# Patient Record
Sex: Female | Born: 1995 | Hispanic: Yes | Marital: Single
Health system: Southern US, Community
[De-identification: ages and names within clinical notes are randomized; demographics above are authoritative.]

---

## 2020-09-16 ENCOUNTER — Emergency Department (HOSPITAL_COMMUNITY)
Admission: EM | Admit: 2020-09-16 | Discharge: 2020-09-17 | Disposition: A | Discharge status: Home / Self Care | Payer: Self-pay | Attending: Emergency Medicine | Admitting: Emergency Medicine

## 2020-09-16 ENCOUNTER — Encounter (HOSPITAL_COMMUNITY): Payer: Self-pay | Admitting: Emergency Medicine

## 2020-09-16 ENCOUNTER — Other Ambulatory Visit: Payer: Self-pay

## 2020-09-16 DIAGNOSIS — M546 Pain in thoracic spine: Secondary | ICD-10-CM | POA: Insufficient documentation

## 2020-09-16 NOTE — ED Triage Notes (Signed)
Pt c/o mid to upper back pain x 1 month. Reports work related injury at beginning of the year,no new injuries.

## 2020-09-17 ENCOUNTER — Emergency Department (HOSPITAL_COMMUNITY): Payer: Self-pay

## 2020-09-17 MED ORDER — IBUPROFEN 800 MG PO TABS: 800.0000 mg | ORAL_TABLET | Freq: Three times a day (TID) | ORAL | 0 refills | Status: AC | PRN

## 2020-09-17 MED ORDER — DICLOFENAC SODIUM 1 % EX GEL: 2.0000 g | Freq: Four times a day (QID) | CUTANEOUS | 0 refills | Status: AC

## 2020-09-17 MED ORDER — TIZANIDINE HCL 4 MG PO TABS: 4.0000 mg | ORAL_TABLET | Freq: Four times a day (QID) | ORAL | 0 refills | Status: AC | PRN

## 2020-09-17 NOTE — Discharge Instructions (Addendum)
You have been seen in the Emergency Department (ED)  today for back pain.  Your workup and exam have not shown any acute abnormalities and you are likely suffering from muscle strain or possible problems with your discs, but there is no treatment that will fix your symptoms at this time.  Please take Motrin (ibuprofen) as needed for your pain according to the instructions written on the box.  Alternatively, for the next five days you can take 800mg three times daily with meals (it may upset your stomach).   Please follow up with your doctor as soon as possible regarding today's ED visit and your back pain.  Return to the ED for worsening back pain, fever, weakness or numbness of either leg, or if you develop either (1) an inability to urinate or have bowel movements, or (2) loss of your ability to control your bathroom functions (if you start having "accidents"), or if you develop other new symptoms that concern you.  

## 2020-09-17 NOTE — ED Notes (Signed)
Patient transported to X-ray 

## 2020-09-17 NOTE — ED Provider Notes (Signed)
Emergency Department Provider Note   I have reviewed the triage vital signs and the nursing notes.   HISTORY  Chief Complaint Back Pain   HPI Gabriela Henry is a 25 y.o. female with past medical history reviewed below presents to the emergency department with intermittent thoracic back pain.  Patient states that she injured herself at work in January.  She has had intermittent pain in the area despite physical therapy and evaluation by [employer] physician.  No clear diagnosis was made.  She is not having weakness.  The patient does have some radiation of discomfort into the shoulders.  She is no longer doing physical therapy.  No numbness in the arms or legs.  No additional injuries.  She is able to work but only 2 days/week and has to take frequent rests.  She is mainly taking Tylenol for pain and feels that that is working less and less.  She had more severe pain yesterday which prompted an ED evaluation today.   History reviewed. No pertinent past medical history.  There are no problems to display for this patient.   History reviewed. No pertinent surgical history.  Allergies Patient has no known allergies.  No family history on file.  Social History    Review of Systems  Constitutional: No fever/chills Cardiovascular: Denies chest pain. Respiratory: Denies shortness of breath. Gastrointestinal: No abdominal pain.  Genitourinary: Negative for dysuria. Musculoskeletal: Positive for thoracic back pain. Skin: Negative for rash. Neurological: Negative for focal weakness or numbness.   ____________________________________________   PHYSICAL EXAM:  VITAL SIGNS: ED Triage Vitals [09/16/20 2140]  Enc Vitals Group     BP 127/64     Pulse Rate 85     Resp 16     Temp 99.3 F (37.4 C)     Temp Source Oral     SpO2 98 %   Constitutional: Alert and oriented. Well appearing and in no acute distress. Eyes: Conjunctivae are normal.  Head:  Atraumatic. Nose: No congestion/rhinnorhea. Mouth/Throat: Mucous membranes are moist.   Neck: No stridor.  No cervical spine tenderness to palpation. Cardiovascular: Good peripheral circulation.  Respiratory: Normal respiratory effort.   Gastrointestinal: No distention.  Musculoskeletal: No gross deformities of extremities. Mid-thoracic back tenderness in the midline and paraspinal musculature. No step-off.  Neurologic:  Normal speech and language. No gross focal neurologic deficits are appreciated.  Skin:  Skin is warm, dry and intact. No rash noted.  ____________________________________________  RADIOLOGY  DG Thoracic Spine 2 View  Result Date: 09/17/2020 CLINICAL DATA:  25 year old female with mid back pain, no history of trauma. EXAM: THORACIC SPINE 2 VIEWS COMPARISON:  None. FINDINGS: There is no evidence of thoracic spine fracture. Alignment is normal. No other significant bone abnormalities are identified. IMPRESSION: No evidence of acute fracture or malalignment. No significant degenerative changes. Electronically Signed   By: Marliss Coots MD   On: 09/17/2020 09:07    ____________________________________________   PROCEDURES  Procedure(s) performed:   Procedures  None  ____________________________________________   INITIAL IMPRESSION / ASSESSMENT AND PLAN / ED COURSE  Pertinent labs & imaging results that were available during my care of the patient were reviewed by me and considered in my medical decision making (see chart for details).   Patient was seen in the emergency room today with mid back pain since January.  This seems very musculoskeletal.  Considered vascular etiologies of pain but patient is exceedingly low risk for this and her pain is reproducible to palpation along  her thoracic spine.  She is mainly tenderness in the paraspinal musculature.  She has been taking Tylenol but no NSAIDs.  Plan for Motrin, Voltaren gel, muscle relaxer as needed for severe  symptoms.  Cautioned on the drowsy side effects of muscle relaxers.  Also placed contact information on the AVS for spine specialist follow-up.  No red flag signs or symptoms to prompt emergent MRI or other advanced imaging in the emergency department.   ____________________________________________  FINAL CLINICAL IMPRESSION(S) / ED DIAGNOSES  Final diagnoses:  Acute midline thoracic back pain    NEW OUTPATIENT MEDICATIONS STARTED DURING THIS VISIT:  New Prescriptions   DICLOFENAC SODIUM (VOLTAREN) 1 % GEL    Apply 2 g topically 4 (four) times daily.   IBUPROFEN (ADVIL) 800 MG TABLET    Take 1 tablet (800 mg total) by mouth every 8 (eight) hours as needed for moderate pain.   TIZANIDINE (ZANAFLEX) 4 MG TABLET    Take 1 tablet (4 mg total) by mouth every 6 (six) hours as needed for muscle spasms.    Note:  This document was prepared using Dragon voice recognition software and may include unintentional dictation errors.  Alona Bene, MD, [facility]    Elayna Tobler, Arlyss Repress, MD 09/17/20 [id number]

## 2022-10-27 IMAGING — DX DG THORACIC SPINE 2V
3 series · 3 of 3 positions shown · non-contrast
Comparison: None.

CLINICAL DATA: 24-year-old female with mid back pain, no history of
trauma.

EXAM:
THORACIC SPINE 2 VIEWS

[t-spine ap]
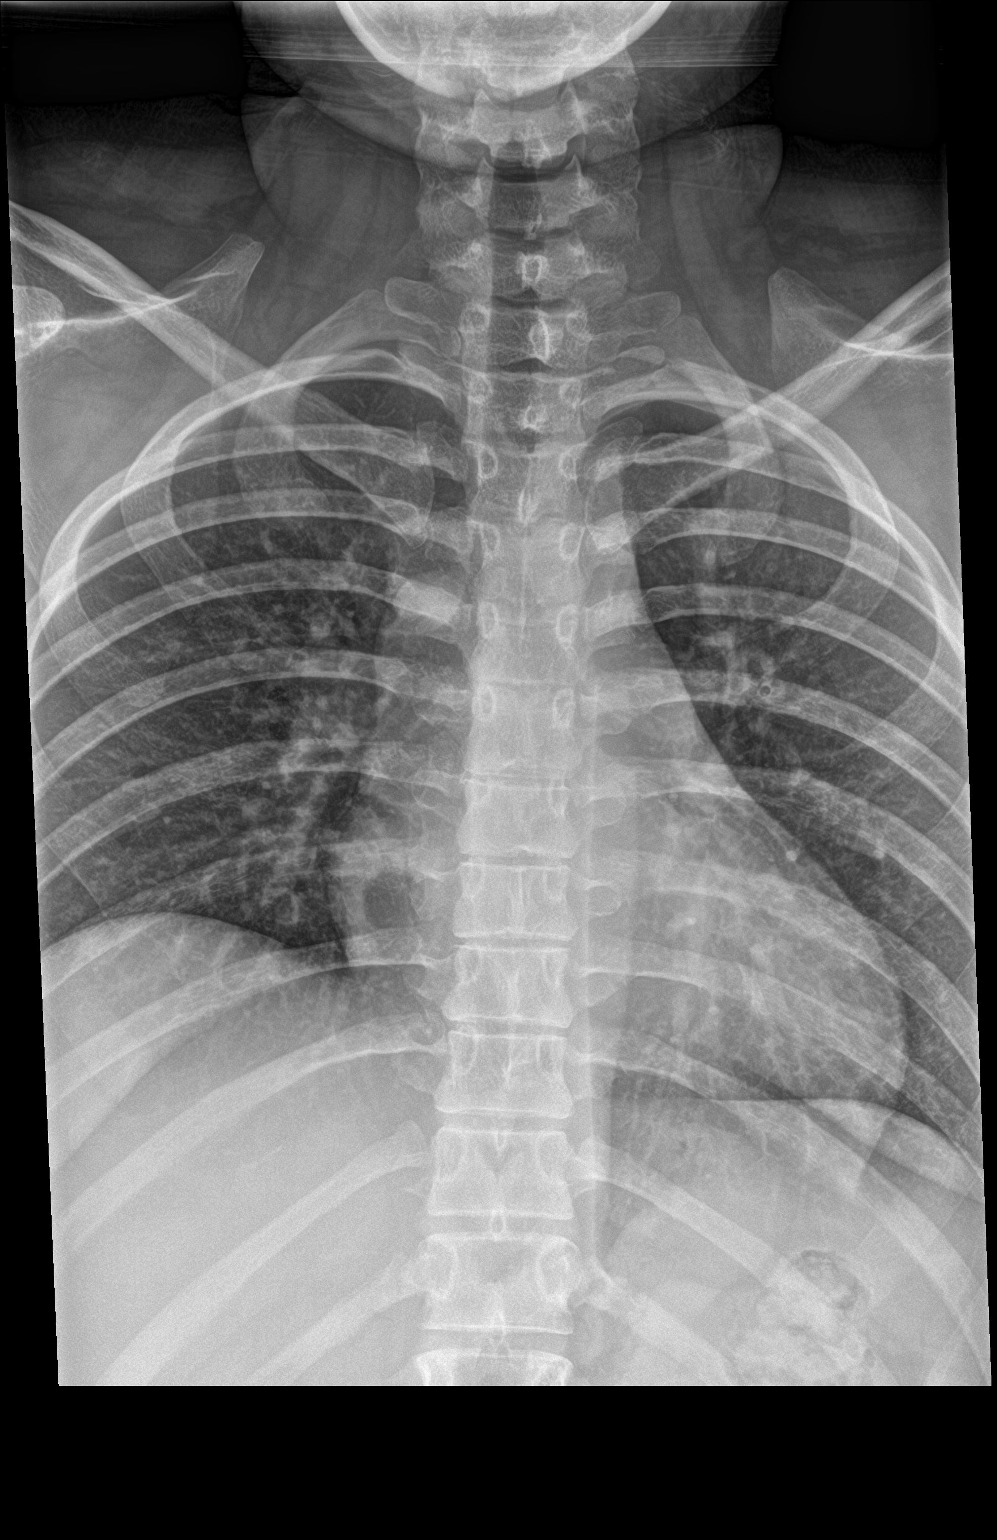

[t-spine lat]
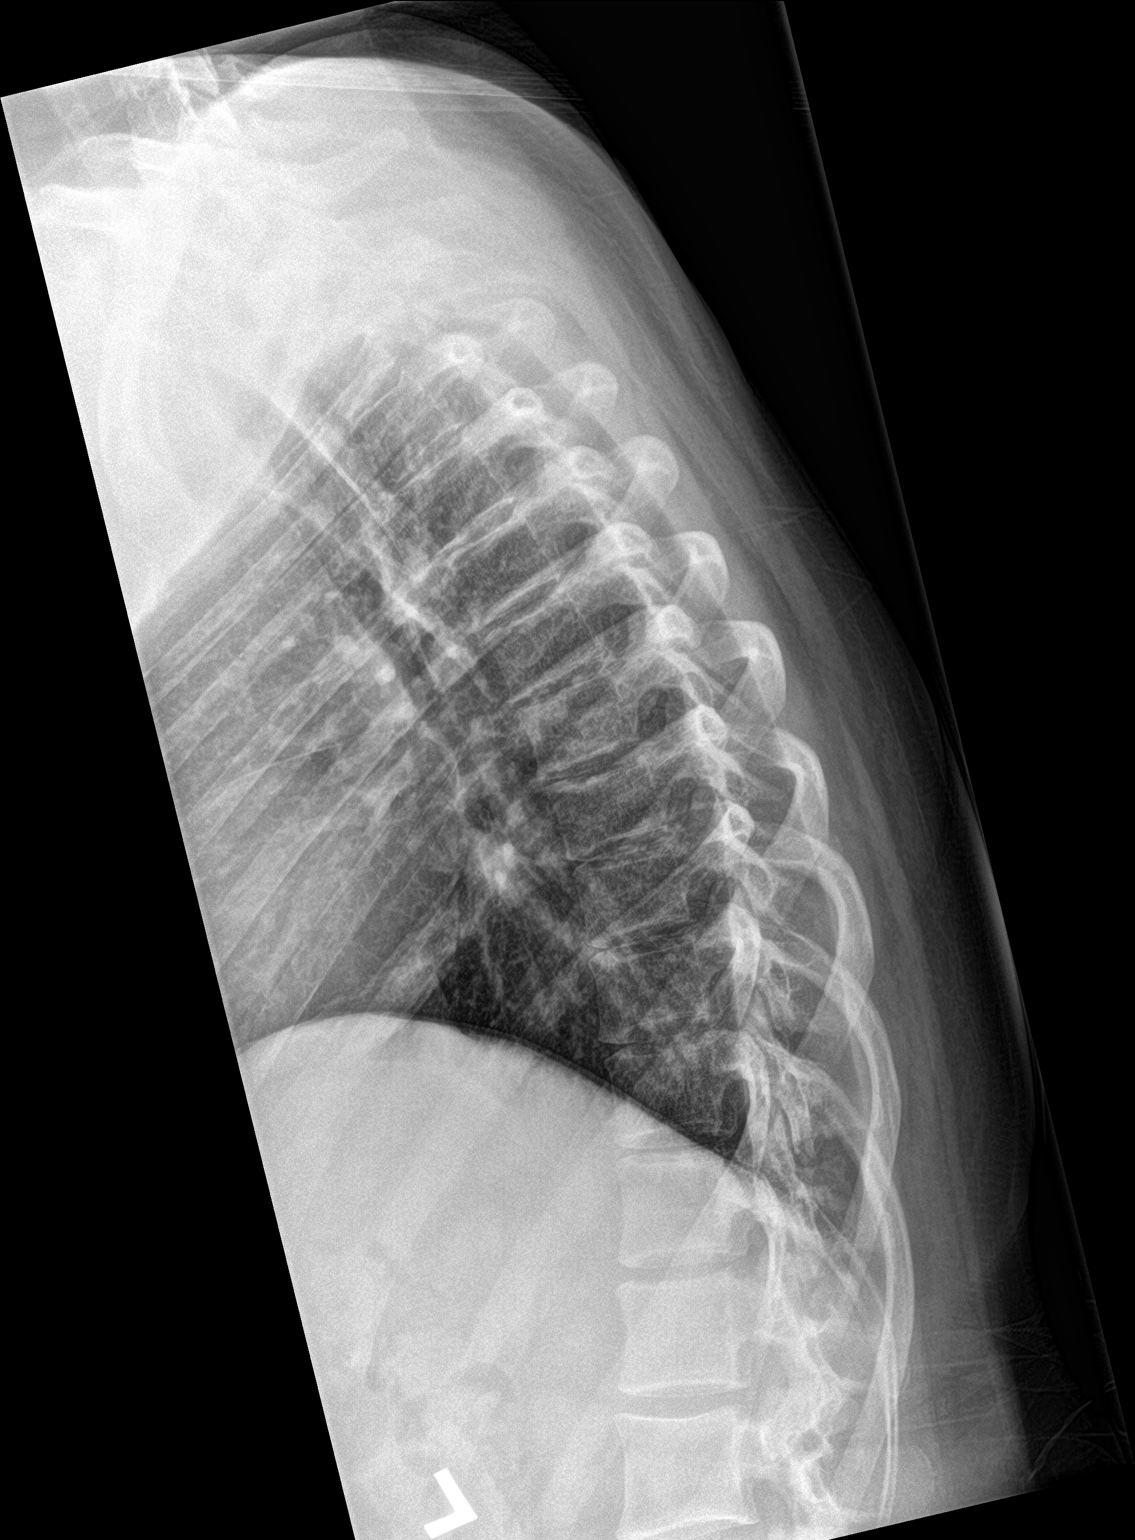

[t-spine swimmers]
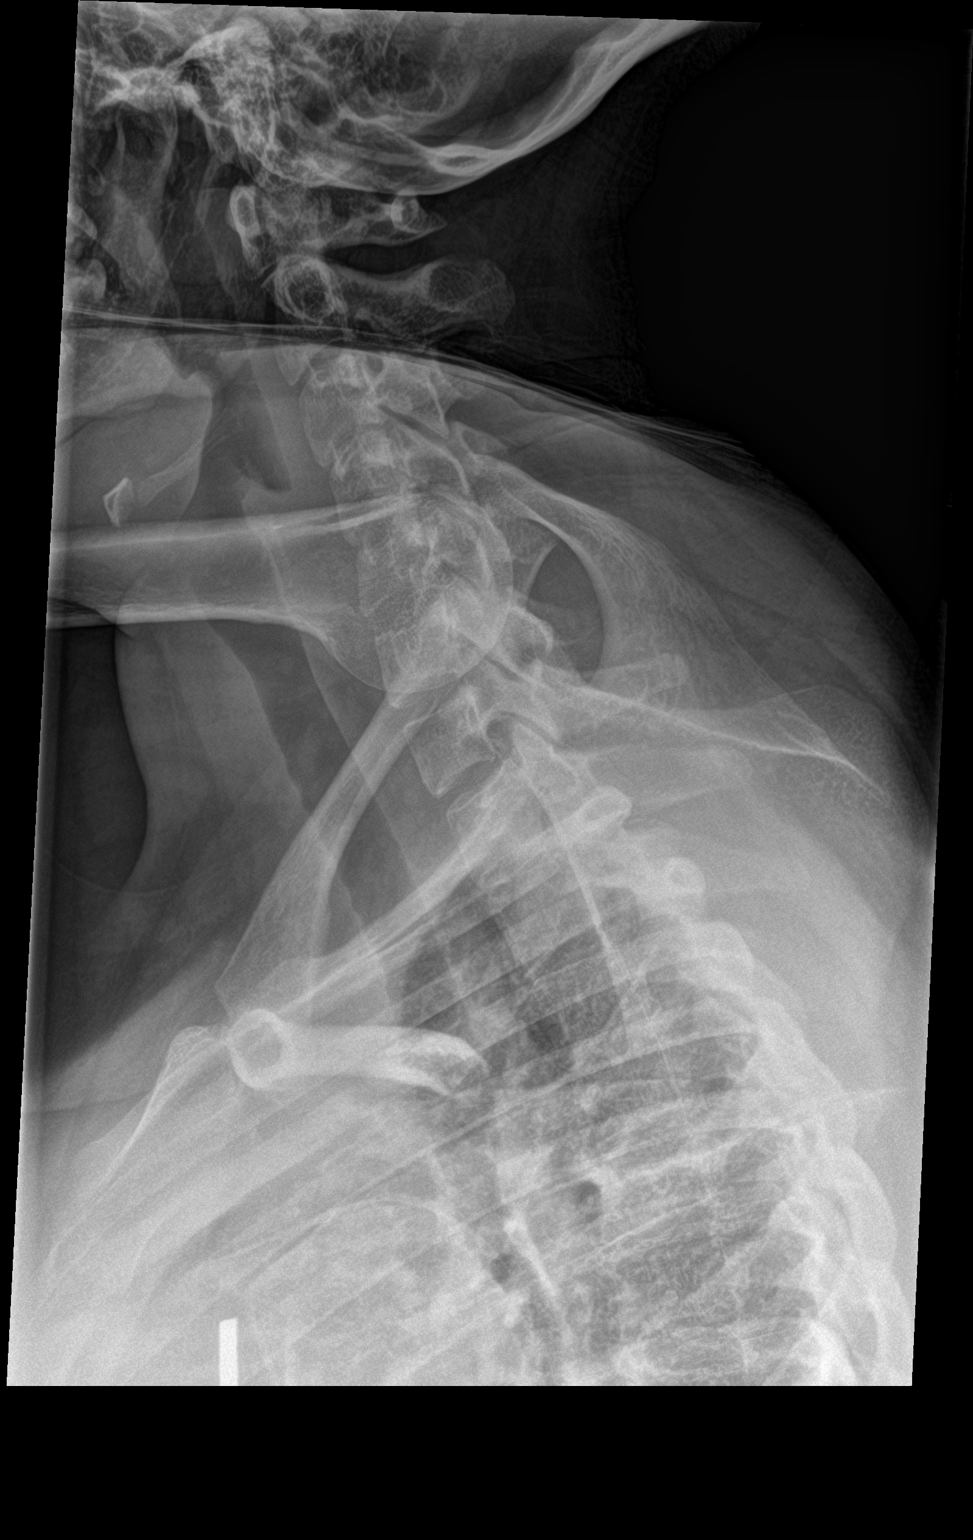

[3 of 3 positions shown; findings below may reference images not displayed]

FINDINGS: There is no evidence of thoracic spine fracture. Alignment is
normal. No other significant bone abnormalities are identified.
IMPRESSION: No evidence of acute fracture or malalignment. No significant
degenerative changes.

## 2023-10-04 ENCOUNTER — Ambulatory Visit: Payer: Self-pay | Admitting: Neurology
